# Patient Record
Sex: Male | Born: 1990 | Race: Black or African American | Hispanic: No | Marital: Single | Smoking: Never smoker
Health system: Southern US, Community
[De-identification: ages and names within clinical notes are randomized; demographics above are authoritative.]

## PROBLEM LIST (undated history)

## (undated) DIAGNOSIS — F1010 Alcohol abuse, uncomplicated: Secondary | ICD-10-CM

---
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
## 2010-12-10 ENCOUNTER — Emergency Department: Payer: Self-pay | Admitting: Emergency Medicine

## 2015-02-12 ENCOUNTER — Encounter (HOSPITAL_COMMUNITY): Payer: Self-pay | Admitting: Emergency Medicine

## 2015-02-12 ENCOUNTER — Emergency Department (HOSPITAL_COMMUNITY)
Admission: EM | Admit: 2015-02-12 | Discharge: 2015-02-12 | Disposition: A | Discharge status: Home / Self Care | Payer: Self-pay | Attending: Physician Assistant | Admitting: Physician Assistant

## 2015-02-12 DIAGNOSIS — M79661 Pain in right lower leg: Secondary | ICD-10-CM | POA: Insufficient documentation

## 2015-02-12 DIAGNOSIS — M79604 Pain in right leg: Secondary | ICD-10-CM

## 2015-02-12 DIAGNOSIS — Z720 Tobacco use: Secondary | ICD-10-CM | POA: Insufficient documentation

## 2015-02-12 NOTE — Discharge Instructions (Signed)
Please follow-up with primary care for further evaluation and management. Please use Tylenol, ibuprofen, ice as needed for pain.

## 2015-02-12 NOTE — ED Provider Notes (Signed)
CSN: [id number]     Arrival date & time 02/12/15  2159 History  This chart was scribed for non-physician practitioner, Eyvonne Mechanic, PA-C working with Abelino Derrick, MD by Doreatha Martin, ED scribe. This patient was seen in room TR09C/TR09C and the patient's care was started at 10:28 PM    Chief Complaint  Patient presents with  . Leg Pain   The history is provided by the patient. No language interpreter was used.    HPI Comments: Erik Amos. is a 24 y.o. male who presents to the Emergency Department complaining of moderate right leg pain to the medial aspect of the knee and upper calf, described as tightness, onset this morning. He states no pain with palpation. He states associated mild swelling to the right knee. He notes that he noticed bulging veins to his dorsal calf. Pt reports that pain is exacerbated with ambulation. He states he felt the pain as he was leaving work. 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 Pt is a current smoker. No Hx of PE/DVT, recent travel, surgery, prolonged immobilization, blood disorders, cancer. Pt is not currently followed by a PCP. Pt denies recent trauma, injury, heavy lifting, falls. He also denies SOB, CP, leg swelling other than at the knee.   History reviewed. No pertinent past surgical history. No family history on file. Social History  Substance Use Topics  . Smoking status: Never Smoker   . Smokeless tobacco: None  . Alcohol Use: No    Review of Systems  All other systems reviewed and are negative.  Allergies  Review of patient's allergies indicates no known allergies.  Home Medications   Prior to Admission medications   Medication Sig Start Date End Date Taking? Authorizing Provider  ibuprofen (ADVIL,MOTRIN) 200 MG tablet Take 200 mg by mouth every 6 (six) hours as needed for moderate pain.   Yes Historical Provider, MD  naphazoline-pheniramine (NAPHCON-A) 0.025-0.3 % ophthalmic solution Place 1 drop into both eyes daily as  needed for allergies.   Yes Historical Provider, MD   BP 127/73 mmHg  Pulse 63  Temp(Src) 98.7 F (37.1 C) (Oral)  Resp 18  SpO2 100%   Physical Exam  Constitutional: He is oriented to person, place, and time. He appears well-developed and well-nourished.  HENT:  Head: Normocephalic and atraumatic.  Eyes: Conjunctivae and EOM are normal. Pupils are equal, round, and reactive to light.  Neck: Normal range of motion. Neck supple.  Cardiovascular: Normal rate.   Pulmonary/Chest: Effort normal. No respiratory distress.  Abdominal: He exhibits no distension.  Musculoskeletal: Normal range of motion.  Left knee nontender to palpation, no pain to palpation of proximal or distal lower extremity, no obvious swelling or deformities, no rash. Ambulates without difficulty. minimal tenderness with hip flexion at the insertion of the biceps femoris, no pain with forced dorsiflexion. No lower extremity swelling or edema, legs are symmetrical bilateral  Neurological: He is alert and oriented to person, place, and time.  Skin: Skin is warm and dry.  Psychiatric: He has a normal mood and affect. His behavior is normal.  Nursing note and vitals reviewed.  ED Course  Procedures (including critical care time) Labs Review Labs Reviewed - No data to display  Imaging Review No results found. I have personally reviewed and evaluated these images and lab results as part of my medical decision-making.   EKG Interpretation None      MDM   Final diagnoses:  Pain of right lower extremity  Labs:  Imaging:  Consults:  Therapeutics:  Discharge Meds:   Assessment/Plan: Patient has no acute findings on exam, this is not septic arthritis, gouty arthritis, DVT, or any significant musculoskeletal injury. Patient requests a work note, with no significant findings on my exam he will not be given a work note. Advised pt to use ice, Ibuprofen, Tylenol, light stretching, follow up with a PCP. Informed  pt of strict return precautions.    I personally performed the services described in this documentation, which was scribed in my presence. The recorded information has been reviewed and is accurate.     Eyvonne Mechanic, PA-C 02/12/15 2344  Courteney Randall An, MD 02/13/15 1610

## 2015-02-12 NOTE — ED Notes (Signed)
Pt. reports pain behind right knee onset yesterday , denies injury/ambulatory.

## 2015-12-16 ENCOUNTER — Emergency Department
Admission: EM | Admit: 2015-12-16 | Discharge: 2015-12-16 | Disposition: A | Discharge status: Home / Self Care | Payer: Self-pay | Attending: Emergency Medicine | Admitting: Emergency Medicine

## 2015-12-16 DIAGNOSIS — H6993 Unspecified Eustachian tube disorder, bilateral: Secondary | ICD-10-CM | POA: Insufficient documentation

## 2015-12-16 DIAGNOSIS — J309 Allergic rhinitis, unspecified: Secondary | ICD-10-CM | POA: Insufficient documentation

## 2015-12-16 DIAGNOSIS — H6983 Other specified disorders of Eustachian tube, bilateral: Secondary | ICD-10-CM

## 2015-12-16 DIAGNOSIS — H8113 Benign paroxysmal vertigo, bilateral: Secondary | ICD-10-CM

## 2015-12-16 MED ORDER — CETIRIZINE HCL 10 MG PO TABS: 10.0000 mg | ORAL_TABLET | Freq: Every day | ORAL | Status: DC

## 2015-12-16 MED ORDER — MECLIZINE HCL 32 MG PO TABS: 32.0000 mg | ORAL_TABLET | Freq: Three times a day (TID) | ORAL | Status: AC | PRN

## 2015-12-16 MED ORDER — FLUTICASONE PROPIONATE 50 MCG/ACT NA SUSP: 1.0000 | Freq: Two times a day (BID) | NASAL | Status: DC

## 2015-12-16 NOTE — Discharge Instructions (Signed)
Allergic Rhinitis Allergic rhinitis is when the mucous membranes in the nose respond to allergens. Allergens are particles in the air that cause your body to have an allergic reaction. This causes you to release allergic antibodies. Through a chain of events, these eventually cause you to release histamine into the blood stream. Although meant to protect the body, it is this release of histamine that causes your discomfort, such as frequent sneezing, congestion, and an itchy, runny nose.  CAUSES Seasonal allergic rhinitis (hay fever) is caused by pollen allergens that may come from grasses, trees, and weeds. Year-round allergic rhinitis (perennial allergic rhinitis) is caused by allergens such as house dust mites, pet dander, and mold spores. SYMPTOMS  Nasal stuffiness (congestion).  Itchy, runny nose with sneezing and tearing of the eyes. DIAGNOSIS Your health care provider can help you determine the allergen or allergens that trigger your symptoms. If you and your health care provider are unable to determine the allergen, skin or blood testing may be used. Your health care provider will diagnose your condition after taking your health history and performing a physical exam. Your health care provider may assess you for other related conditions, such as asthma, pink eye, or an ear infection. TREATMENT Allergic rhinitis does not have a cure, but it can be controlled by:  Medicines that block allergy symptoms. These may include allergy shots, nasal sprays, and oral antihistamines.  Avoiding the allergen. Hay fever may often be treated with antihistamines in pill or nasal spray forms. Antihistamines block the effects of histamine. There are over-the-counter medicines that may help with nasal congestion and swelling around the eyes. Check with your health care provider before taking or giving this medicine. If avoiding the allergen or the medicine prescribed do not work, there are many new medicines  your health care provider can prescribe. Stronger medicine may be used if initial measures are ineffective. Desensitizing injections can be used if medicine and avoidance does not work. Desensitization is when a patient is given ongoing shots until the body becomes less sensitive to the allergen. Make sure you follow up with your health care provider if problems continue. HOME CARE INSTRUCTIONS It is not possible to completely avoid allergens, but you can reduce your symptoms by taking steps to limit your exposure to them. It helps to know exactly what you are allergic to so that you can avoid your specific triggers. SEEK MEDICAL CARE IF:  You have a fever.  You develop a cough that does not stop easily (persistent).  You have shortness of breath.  You start wheezing.  Symptoms interfere with normal daily activities.   This information is not intended to replace advice given to you by your health care provider. Make sure you discuss any questions you have with your health care provider.   Document Released: 02/22/2001 Document Revised: 06/20/2014 Document Reviewed: 02/04/2013 Elsevier Interactive Patient Education 2016 Else[employer] Inc[employer]  Benign Positional Vertigo Vertigo is the feeling that you or your surroundings are moving when they are not. Benign positional vertigo is the most common form of vertigo. The cause of this condition is not serious (is benign). This condition is triggered by certain movements and positions (is positional). This condition can be dangerous if it occurs while you are doing something that could endanger you or others, such as driving.  CAUSES In many cases, the cause of this condition is not known. It may be caused by a disturbance in an area of the inner ear that helps your brain  to sense movement and balance. This disturbance can be caused by a viral infection (labyrinthitis), head injury, or repetitive motion. RISK FACTORS This condition is more likely to develop  in:  Women.  People who are 25 years of age or older. SYMPTOMS Symptoms of this condition usually happen when you move your head or your eyes in different directions. Symptoms may start suddenly, and they usually last for less than a minute. Symptoms may include:  Loss of balance and falling.  Feeling like you are spinning or moving.  Feeling like your surroundings are spinning or moving.  Nausea and vomiting.  Blurred vision.  Dizziness.  Involuntary eye movement (nystagmus). Symptoms can be mild and cause only slight annoyance, or they can be severe and interfere with daily life. Episodes of benign positional vertigo may return (recur) over time, and they may be triggered by certain movements. Symptoms may improve over time. DIAGNOSIS This condition is usually diagnosed by medical history and a physical exam of the head, neck, and ears. You may be referred to a health care provider who specializes in ear, nose, and throat (ENT) problems (otolaryngologist) or a provider who specializes in disorders of the nervous system (neurologist). You may have additional testing, including:  MRI.  A CT scan.  Eye movement tests. Your health care provider may ask you to change positions quickly while he or she watches you for symptoms of benign positional vertigo, such as nystagmus. Eye movement may be tested with an electronystagmogram (ENG), caloric stimulation, the Dix-Hallpike test, or the roll test.  An electroencephalogram (EEG). This records electrical activity in your brain.  Hearing tests. TREATMENT Usually, your health care provider will treat this by moving your head in specific positions to adjust your inner ear back to normal. Surgery may be needed in severe cases, but this is rare. In some cases, benign positional vertigo may resolve on its own in 2-4 weeks. HOME CARE INSTRUCTIONS Safety  Move slowly.Avoid sudden body or head movements.  Avoid driving.  Avoid operating  heavy machinery.  Avoid doing any tasks that would be dangerous to you or others if a vertigo episode would occur.  If you have trouble walking or keeping your balance, try using a cane for stability. If you feel dizzy or unstable, sit down right away.  Return to your normal activities as told by your health care provider. Ask your health care provider what activities are safe for you. General Instructions  Take over-the-counter and prescription medicines only as told by your health care provider.  Avoid certain positions or movements as told by your health care provider.  Drink enough fluid to keep your urine clear or pale yellow.  Keep all follow-up visits as told by your health care provider. This is important. SEEK MEDICAL CARE IF:  You have a fever.  Your condition gets worse or you develop new symptoms.  Your family or friends notice any behavioral changes.  Your nausea or vomiting gets worse.  You have numbness or a "pins and needles" sensation. SEEK IMMEDIATE MEDICAL CARE IF:  You have difficulty speaking or moving.  You are always dizzy.  You faint.  You develop severe headaches.  You have weakness in your legs or arms.  You have changes in your hearing or vision.  You develop a stiff neck.  You develop sensitivity to light.   This information is not intended to replace advice given to you by your health care provider. Make sure you discuss any questions you  have with your health care provider.   Document Released: 03/07/2006 Document Revised: 02/18/2015 Document Reviewed: 09/22/2014 Elsevier Interactive Patient Education 2016 [employer] Barotitis media is inflammation of your middle ear. This occurs when the auditory tube (eustachian tube) leading from the back of your nose (nasopharynx) to your eardrum is blocked. This blockage may result from a cold, environmental allergies, or an upper respiratory infection. Unresolved barotitis media  may lead to damage or hearing loss (barotrauma), which may become permanent. HOME CARE INSTRUCTIONS   Use medicines as recommended by your health care provider. Over-the-counter medicines will help unblock the canal and can help during times of air travel.  Do not put anything into your ears to clean or unplug them. Eardrops will not be helpful.  Do not swim, dive, or fly until your health care provider says it is all right to do so. If these activities are necessary, chewing gum with frequent, forceful swallowing may help. It is also helpful to hold your nose and gently blow to pop your ears for equalizing pressure changes. This forces air into the eustachian tube.  Only take over-the-counter or prescription medicines for pain, discomfort, or fever as directed by your health care provider.  A decongestant may be helpful in decongesting the middle ear and make pressure equalization easier. SEEK MEDICAL CARE IF:  You experience a serious form of dizziness in which you feel as if the room is spinning and you feel nauseated (vertigo).  Your symptoms only involve one ear. SEEK IMMEDIATE MEDICAL CARE IF:   You develop a severe headache, dizziness, or severe ear pain.  You have bloody or pus-like drainage from your ears.  You develop a fever.  Your problems do not improve or become worse. MAKE SURE YOU:   Understand these instructions.  Will watch your condition.  Will get help right away if you are not doing well or get worse.   This information is not intended to replace advice given to you by your health care provider. Make sure you discuss any questions you have with your health care provider.   Document Released: 05/27/2000 Document Revised: 03/20/2013 Document Reviewed: 12/25/2012 Elsevier Interactive Patient Education [employer]

## 2015-12-16 NOTE — ED Notes (Signed)
Pt in via triage with complaints of being light headed, feeling as if his ears are "clogged" x 1 week.  Pt denies any other symptoms.  Pt A/Ox4, in no immediate distress.

## 2015-12-16 NOTE — ED Notes (Addendum)
Pt in with co not able to hear well, states hears muffled, bilat earaches. Denies any cold symptoms or illness, also co dizziness states google told him it was vertigo. Denies any n.v.d or other pain.

## 2015-12-16 NOTE — ED Provider Notes (Signed)
[facility] Emergency Department Provider Note  ____________________________________________  Time seen: Approximately 10:35 PM  I have reviewed the triage vital signs and the nursing notes.   HISTORY  Chief Complaint Ear Fullness    HPI Erik CruiseDavid F Lisbon Montez HagemanJr. is a 25 y.o. male who presents to emergency department complaining of "ear fullness" to bilateral ears, and vertigo-type symptoms. Patient states that he has seasonal allergies and has noticed that he has been sneezing more. Patient reports that when he stands he feels like the room spins around. Patient denies any headache, fevers or chills, ear pain, sore throat, nasal congestion, cough, shortness of breath, abdominal pain, nausea or vomiting.   No past medical history on file.  There are no active problems to display for this patient.   No past surgical history on file.  Current Outpatient Rx  Name  Route  Sig  Dispense  Refill  . cetirizine (ZYRTEC) 10 MG tablet   Oral   Take 1 tablet (10 mg total) by mouth daily.   30 tablet   0   . fluticasone (FLONASE) 50 MCG/ACT nasal spray   Each Nare   Place 1 spray into both nostrils 2 (two) times daily.   16 g   0   . ibuprofen (ADVIL,MOTRIN) 200 MG tablet   Oral   Take 200 mg by mouth every 6 (six) hours as needed for moderate pain.         . meclizine (ANTIVERT) 32 MG tablet   Oral   Take 1 tablet (32 mg total) by mouth 3 (three) times daily as needed.   30 tablet   0   . naphazoline-pheniramine (NAPHCON-A) 0.025-0.3 % ophthalmic solution   Both Eyes   Place 1 drop into both eyes daily as needed for allergies.           Allergies Review of patient's allergies indicates no known allergies.  No family history on file.  Social History Social History  Substance Use Topics  . Smoking status: Never Smoker   . Smokeless tobacco: Not on file  . Alcohol Use: No     Review of Systems  Constitutional: No fever/chills. Positive  for vertigo symptoms Eyes: No visual changes. No discharge ENT: Positive for bilateral "ear fullness" Cardiovascular: no chest pain. Respiratory: no cough. No SOB. Gastrointestinal: No abdominal pain.  No nausea, no vomiting.   Musculoskeletal: Negative for musculoskeletal pain. Skin: Negative for rash, abrasions, lacerations, ecchymosis. Neurological: Negative for headaches, focal weakness or numbness. 10-point ROS otherwise negative.  ____________________________________________   PHYSICAL EXAM:  VITAL SIGNS: ED Triage Vitals  Enc Vitals Group     BP 12/16/15 2126 129/65 mmHg     Pulse Rate 12/16/15 2126 87     Resp 12/16/15 2126 18     Temp 12/16/15 2126 98.8 F (37.1 C)     Temp Source 12/16/15 2126 Oral     SpO2 12/16/15 2126 99 %     Weight 12/16/15 2126 175 lb (79.379 kg)     Height 12/16/15 2126 5\' 11"  (1.803 m)     Head Cir --      Peak Flow --      Pain Score 12/16/15 2127 7     Pain Loc --      Pain Edu? --      Excl. in GC? --      Constitutional: Alert and oriented. Well appearing and in no acute distress. Eyes: Conjunctivae are normal. PERRL. EOMI. Head: Atraumatic.  ENT:      Ears: EACs unremarkable bilaterally. TMs are slightly bulging with no air-fluid level.      Nose: Mild clear congestion/rhinnorhea. Turbinates are boggy in appearance.      Mouth/Throat: Mucous membranes are moist.  Neck: No stridor.    Cardiovascular: Normal rate, regular rhythm. Normal S1 and S2.  Good peripheral circulation. Respiratory: Normal respiratory effort without tachypnea or retractions. Lungs CTAB. Good air entry to the bases with no decreased or absent breath sounds. Musculoskeletal: Full range of motion to all extremities. No gross deformities appreciated. Neurologic:  Normal speech and language. No gross focal neurologic deficits are appreciated.  Skin:  Skin is warm, dry and intact. No rash noted. Psychiatric: Mood and affect are normal. Speech and behavior are  normal. Patient exhibits appropriate insight and judgement.   ____________________________________________   LABS (all labs ordered are listed, but only abnormal results are displayed)  Labs Reviewed - No data to display ____________________________________________  EKG   ____________________________________________  RADIOLOGY   No results found.  ____________________________________________    PROCEDURES  Procedure(s) performed:       Medications - No data to display   ____________________________________________   INITIAL IMPRESSION / ASSESSMENT AND PLAN / ED COURSE  Pertinent labs & imaging results that were available during my care of the patient were reviewed by me and considered in my medical decision making (see chart for details).  Patient's diagnosis is consistent with allergic rhinitis causing eustachian tube dysfunction leading to vertigo.. Patient will be discharged home with prescriptions for Zyrtec, Flonase, meclizine. Patient is to follow up with primary care provider as needed or otherwise directed. Patient is given ED precautions to return to the ED for any worsening or new symptoms.     ____________________________________________  FINAL CLINICAL IMPRESSION(S) / ED DIAGNOSES  Final diagnoses:  Vertigo, benign paroxysmal, bilateral  Eustachian tube dysfunction, bilateral  Allergic rhinitis, unspecified allergic rhinitis type      NEW MEDICATIONS STARTED DURING THIS VISIT:  New Prescriptions   CETIRIZINE (ZYRTEC) 10 MG TABLET    Take 1 tablet (10 mg total) by mouth daily.   FLUTICASONE (FLONASE) 50 MCG/ACT NASAL SPRAY    Place 1 spray into both nostrils 2 (two) times daily.   MECLIZINE (ANTIVERT) 32 MG TABLET    Take 1 tablet (32 mg total) by mouth 3 (three) times daily as needed.        This chart was dictated using voice recognition software/Dragon. Despite best efforts to proofread, errors can occur which can change the  meaning. Any change was purely unintentional.    Racheal PatchesJonathan D Cuthriell, PA-C 12/16/15 2243  Arnaldo NatalPaul F Malinda, MD 12/16/15 [phone]

## 2016-12-13 ENCOUNTER — Emergency Department
Admission: EM | Admit: 2016-12-13 | Discharge: 2016-12-14 | Disposition: A | Discharge status: Home / Self Care | Payer: No Typology Code available for payment source | Attending: Emergency Medicine | Admitting: Emergency Medicine

## 2016-12-13 ENCOUNTER — Encounter: Payer: Self-pay | Admitting: *Deleted

## 2016-12-13 ENCOUNTER — Emergency Department: Payer: No Typology Code available for payment source

## 2016-12-13 DIAGNOSIS — M545 Low back pain: Secondary | ICD-10-CM | POA: Insufficient documentation

## 2016-12-13 DIAGNOSIS — Z79899 Other long term (current) drug therapy: Secondary | ICD-10-CM | POA: Diagnosis not present
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
## 2016-12-14 MED ORDER — MELOXICAM 7.5 MG PO TABS: 7.5000 mg | ORAL_TABLET | Freq: Every day | ORAL | 1 refills | Status: AC

## 2016-12-14 MED ORDER — CYCLOBENZAPRINE HCL 5 MG PO TABS: 5.0000 mg | ORAL_TABLET | Freq: Three times a day (TID) | ORAL | 0 refills | Status: AC | PRN

## 2016-12-14 NOTE — ED Notes (Signed)
Pt discharged to home.  Family member driving.  Discharge instructions reviewed.  Verbalized understanding.  No questions or concerns at this time.  Teach back verified.  Pt in NAD.  No items left in ED.   
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
## 2017-08-10 ENCOUNTER — Encounter: Payer: Self-pay | Admitting: Emergency Medicine

## 2017-08-10 ENCOUNTER — Emergency Department
Admission: EM | Admit: 2017-08-10 | Discharge: 2017-08-10 | Disposition: A | Discharge status: Home / Self Care | Payer: Self-pay | Attending: Emergency Medicine | Admitting: Emergency Medicine

## 2017-08-10 ENCOUNTER — Emergency Department: Payer: Self-pay

## 2017-08-10 DIAGNOSIS — Y92008 Other place in unspecified non-institutional (private) residence as the place of occurrence of the external cause: Secondary | ICD-10-CM | POA: Insufficient documentation

## 2017-08-10 DIAGNOSIS — Y9389 Activity, other specified: Secondary | ICD-10-CM | POA: Insufficient documentation

## 2017-08-10 DIAGNOSIS — Z79899 Other long term (current) drug therapy: Secondary | ICD-10-CM | POA: Insufficient documentation

## 2017-08-10 DIAGNOSIS — Y998 Other external cause status: Secondary | ICD-10-CM | POA: Insufficient documentation

## 2017-08-10 DIAGNOSIS — S8391XA Sprain of unspecified site of right knee, initial encounter: Secondary | ICD-10-CM | POA: Insufficient documentation

## 2017-08-10 DIAGNOSIS — Y33XXXA Other specified events, undetermined intent, initial encounter: Secondary | ICD-10-CM | POA: Insufficient documentation

## 2017-08-10 MED ORDER — IBUPROFEN 600 MG PO TABS: 600.0000 mg | ORAL_TABLET | Freq: Three times a day (TID) | ORAL | 0 refills | Status: DC | PRN

## 2017-08-10 NOTE — ED Triage Notes (Signed)
Pt comes into the ED via POV c/o knee swelling and pain that started 3 days ago.  Denies any known injury other than rolling over one day and hearing a "pop".  Patient ambulatory to triage at this time and in NAD with even and unlabored respirations.

## 2017-08-10 NOTE — ED Provider Notes (Signed)
[facility] Emergency Department Provider Note  ____________________________________________   First MD Initiated Contact with Patient 08/10/17 1423     (approximate)  I have reviewed the triage vital signs and the nursing notes.   HISTORY  Chief Complaint Joint Swelling  HPI Erik AlbaDavid F Gergen Jr. is a 27 y.o. male is here complaint of right knee pain.  Patient states that 3 nights ago he sitting on the sofa.  He states that he rolled his knee over and heard a pop.  He states that since that time he has had some knee pain along with some swelling.  He has not taken any over-the-counter medication is not applied any ice to this area.  He denies any previous knee problems.  Patient has continued to ambulate without assistance.   History reviewed. No pertinent past medical history.  There are no active problems to display for this patient.   History reviewed. No pertinent surgical history.  Prior to Admission medications   Medication Sig Start Date End Date Taking? Authorizing Provider  ibuprofen (ADVIL,MOTRIN) 600 MG tablet Take 1 tablet (600 mg total) by mouth every 8 (eight) hours as needed. 08/10/17   Tommi RumpsSummers, Rhonda L, PA-C  meclizine (ANTIVERT) 32 MG tablet Take 1 tablet (32 mg total) by mouth 3 (three) times daily as needed. 12/16/15   Cuthriell, Delorise RoyalsJonathan D, PA-C    Allergies Patient has no known allergies.  No family history on file.  Social History Social History   Tobacco Use  . Smoking status: Never Smoker  . Smokeless tobacco: Never Used  Substance Use Topics  . Alcohol use: No  . Drug use: No    Review of Systems Constitutional: No fever/chills Cardiovascular: Denies chest pain. Respiratory: Denies shortness of breath. Musculoskeletal: Positive for right knee pain. Skin: Negative for rash. Neurological: Negative for headaches, focal weakness or numbness. ____________________________________________   PHYSICAL EXAM:  VITAL  SIGNS: ED Triage Vitals  Enc Vitals Group     BP 08/10/17 1352 113/64     Pulse Rate 08/10/17 1352 63     Resp 08/10/17 1352 18     Temp 08/10/17 1352 99.2 F (37.3 C)     Temp Source 08/10/17 1352 Oral     SpO2 08/10/17 1352 94 %     Weight 08/10/17 1347 175 lb (79.4 kg)     Height 08/10/17 1347 6' (1.829 m)     Head Circumference --      Peak Flow --      Pain Score 08/10/17 1346 10     Pain Loc --      Pain Edu? --      Excl. in GC? --    Constitutional: Alert and oriented. Well appearing and in no acute distress. Eyes: Conjunctivae are normal.  Head: Atraumatic. Neck: No stridor.   Cardiovascular: Normal rate, regular rhythm. Grossly normal heart sounds.  Good peripheral circulation. Respiratory: Normal respiratory effort.  No retractions. Lungs CTAB. Musculoskeletal: On examination of the right knee there is some soft tissue swelling present.  No gross deformity.  Range of motion is restricted secondary to patient's pain tolerance.  No tenderness is noted to the tib-fib or ankle/foot.  No erythema is present.  Motor sensory function intact.  Skin is intact. Neurologic:  Normal speech and language. No gross focal neurologic deficits are appreciated. No gait instability. Skin:  Skin is warm, dry and intact. No rash noted. Psychiatric: Mood and affect are normal. Speech and behavior are normal.  ____________________________________________   LABS (all labs ordered are listed, but only abnormal results are displayed)  Labs Reviewed - No data to display RADIOLOGY  ED MD interpretation:  Right knee x-ray is negative for fracture.  Official radiology report(s): Dg Knee Complete 4 Views Right  Result Date: 08/10/2017 CLINICAL DATA:  Acute right knee pain. EXAM: RIGHT KNEE - COMPLETE 4+ VIEW COMPARISON:  None. FINDINGS: No evidence of fracture, dislocation, or joint effusion. No evidence of arthropathy or other focal bone abnormality. Soft tissues are unremarkable. IMPRESSION:  Normal right knee. Electronically Signed   By: Lupita Raider, M.D.   On: 08/10/2017 14:11    ____________________________________________   PROCEDURES  Procedure(s) performed: None  Procedures  Critical Care performed: No  ____________________________________________   INITIAL IMPRESSION / ASSESSMENT AND PLAN / ED COURSE Was placed in a knee immobilizer.  He was also given a prescription for ibuprofen 600 mg 3 times daily with food.  He is to ice and elevate as needed for swelling.  He is to refrain from sports as he plays basketball for the next 2 weeks.  Patient also is to follow-up with Dr. Hyacinth Meeker who is the orthopedist on call if he is not improving.  ____________________________________________   FINAL CLINICAL IMPRESSION(S) / ED DIAGNOSES  Final diagnoses:  Sprain of right knee, unspecified ligament, initial encounter     ED Discharge Orders        Ordered    ibuprofen (ADVIL,MOTRIN) 600 MG tablet  Every 8 hours PRN     08/10/17 1441       Note:  This document was prepared using Dragon voice recognition software and may include unintentional dictation errors.    Tommi Rumps, PA-C 08/10/17 1640    Governor Rooks, MD 08/12/17 [phone]
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
## 2017-08-10 NOTE — Discharge Instructions (Signed)
Up with Dr. Hyacinth MeekerMiller who is the orthopedist on call if any continued problems.  Wear knee immobilizer for 1-2 weeks for support and to prevent further injury.  Ice and elevation.  Ibuprofen as needed for pain and inflammation.

## 2017-11-13 ENCOUNTER — Emergency Department
Admission: EM | Admit: 2017-11-13 | Discharge: 2017-11-13 | Disposition: A | Discharge status: Home / Self Care | Payer: No Typology Code available for payment source | Attending: Emergency Medicine | Admitting: Emergency Medicine

## 2017-11-13 ENCOUNTER — Other Ambulatory Visit: Payer: Self-pay

## 2017-11-13 ENCOUNTER — Encounter: Payer: Self-pay | Admitting: Emergency Medicine

## 2017-11-13 ENCOUNTER — Emergency Department: Payer: No Typology Code available for payment source

## 2017-11-13 DIAGNOSIS — M25561 Pain in right knee: Secondary | ICD-10-CM | POA: Insufficient documentation

## 2017-11-13 DIAGNOSIS — Y92410 Unspecified street and highway as the place of occurrence of the external cause: Secondary | ICD-10-CM | POA: Diagnosis not present

## 2017-11-13 DIAGNOSIS — S39012A Strain of muscle, fascia and tendon of lower back, initial encounter: Secondary | ICD-10-CM | POA: Diagnosis not present

## 2017-11-13 DIAGNOSIS — Y998 Other external cause status: Secondary | ICD-10-CM | POA: Insufficient documentation

## 2017-11-13 DIAGNOSIS — Y939 Activity, unspecified: Secondary | ICD-10-CM | POA: Diagnosis not present

## 2017-11-13 MED ORDER — CYCLOBENZAPRINE HCL 5 MG PO TABS: 5.0000 mg | ORAL_TABLET | Freq: Three times a day (TID) | ORAL | 0 refills | Status: AC | PRN

## 2017-11-13 MED ORDER — NAPROXEN 500 MG PO TABS: 500.0000 mg | ORAL_TABLET | Freq: Two times a day (BID) | ORAL | 0 refills | Status: AC
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
## 2018-01-02 ENCOUNTER — Emergency Department
Admission: EM | Admit: 2018-01-02 | Discharge: 2018-01-02 | Disposition: A | Discharge status: Home / Self Care | Payer: Self-pay | Attending: Emergency Medicine | Admitting: Emergency Medicine

## 2018-01-02 ENCOUNTER — Other Ambulatory Visit: Payer: Self-pay

## 2018-01-02 ENCOUNTER — Encounter: Payer: Self-pay | Admitting: Emergency Medicine

## 2018-01-02 ENCOUNTER — Emergency Department: Payer: Self-pay

## 2018-01-02 DIAGNOSIS — Y999 Unspecified external cause status: Secondary | ICD-10-CM | POA: Insufficient documentation

## 2018-01-02 DIAGNOSIS — W260XXA Contact with knife, initial encounter: Secondary | ICD-10-CM | POA: Insufficient documentation

## 2018-01-02 DIAGNOSIS — Y939 Activity, unspecified: Secondary | ICD-10-CM | POA: Insufficient documentation

## 2018-01-02 DIAGNOSIS — Y929 Unspecified place or not applicable: Secondary | ICD-10-CM | POA: Insufficient documentation

## 2018-01-02 DIAGNOSIS — Z23 Encounter for immunization: Secondary | ICD-10-CM | POA: Insufficient documentation

## 2018-01-02 DIAGNOSIS — S61236A Puncture wound without foreign body of right little finger without damage to nail, initial encounter: Secondary | ICD-10-CM | POA: Insufficient documentation

## 2018-01-02 MED ORDER — NAPROXEN 500 MG PO TABS: 500.0000 mg | ORAL_TABLET | Freq: Two times a day (BID) | ORAL | Status: DC

## 2018-01-02 MED ORDER — NAPROXEN 500 MG PO TABS
500.0000 mg | ORAL_TABLET | Freq: Once | ORAL | Status: AC
  Administered 2018-01-02: 500 mg via ORAL
  Filled 2018-01-02: qty 1

## 2018-01-02 MED ORDER — SULFAMETHOXAZOLE-TRIMETHOPRIM 800-160 MG PO TABS: 1.0000 | ORAL_TABLET | Freq: Two times a day (BID) | ORAL | 0 refills | Status: AC

## 2018-01-02 MED ORDER — TETANUS-DIPHTH-ACELL PERTUSSIS 5-2.5-18.5 LF-MCG/0.5 IM SUSP
0.5000 mL | Freq: Once | INTRAMUSCULAR | Status: AC
  Administered 2018-01-02: 0.5 mL via INTRAMUSCULAR
  Filled 2018-01-02: qty 0.5

## 2018-01-02 MED ORDER — SULFAMETHOXAZOLE-TRIMETHOPRIM 800-160 MG PO TABS
1.0000 | ORAL_TABLET | Freq: Once | ORAL | Status: AC
  Administered 2018-01-02: 1 via ORAL
  Filled 2018-01-02: qty 1

## 2018-01-02 NOTE — Discharge Instructions (Addendum)
Wear splint for 2 to 3 days as needed.  Pick up prescription from pharmacy.  [facility]
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
## 2018-04-30 ENCOUNTER — Emergency Department: Payer: Self-pay

## 2018-04-30 ENCOUNTER — Emergency Department
Admission: EM | Admit: 2018-04-30 | Discharge: 2018-04-30 | Disposition: A | Discharge status: Home / Self Care | Payer: Self-pay | Attending: Emergency Medicine | Admitting: Emergency Medicine

## 2018-04-30 ENCOUNTER — Encounter: Payer: Self-pay | Admitting: Emergency Medicine

## 2018-04-30 ENCOUNTER — Other Ambulatory Visit: Payer: Self-pay

## 2018-04-30 DIAGNOSIS — Z79899 Other long term (current) drug therapy: Secondary | ICD-10-CM | POA: Insufficient documentation

## 2018-04-30 DIAGNOSIS — R52 Pain, unspecified: Secondary | ICD-10-CM

## 2018-04-30 DIAGNOSIS — M25561 Pain in right knee: Secondary | ICD-10-CM

## 2018-04-30 MED ORDER — IBUPROFEN 600 MG PO TABS: 600.0000 mg | ORAL_TABLET | Freq: Three times a day (TID) | ORAL | 0 refills | Status: DC | PRN

## 2018-04-30 MED ORDER — CYCLOBENZAPRINE HCL 10 MG PO TABS: 10.0000 mg | ORAL_TABLET | Freq: Three times a day (TID) | ORAL | 0 refills | Status: AC | PRN

## 2018-04-30 MED ORDER — IBUPROFEN 600 MG PO TABS
600.0000 mg | ORAL_TABLET | Freq: Once | ORAL | Status: AC
  Administered 2018-04-30: 600 mg via ORAL
  Filled 2018-04-30: qty 1

## 2018-04-30 MED ORDER — CYCLOBENZAPRINE HCL 10 MG PO TABS
10.0000 mg | ORAL_TABLET | Freq: Once | ORAL | Status: AC
  Administered 2018-04-30: 10 mg via ORAL
  Filled 2018-04-30: qty 1

## 2018-04-30 MED ORDER — TRAMADOL HCL 50 MG PO TABS: 50.0000 mg | ORAL_TABLET | Freq: Two times a day (BID) | ORAL | 0 refills | Status: AC | PRN

## 2018-04-30 NOTE — ED Notes (Signed)
See triage note  Presents with pain to right knee this am  States pain started when he got up from chair  States pain is mainly posterior  No swelling noted

## 2018-04-30 NOTE — Discharge Instructions (Addendum)
Your x-ray shows no acute bony abnormalities.  Wear knee support and ambulate with crutches until evaluation by orthopedics.  Call today to schedule appointment.

## 2018-04-30 NOTE — ED Triage Notes (Signed)
Says last night he stood up and had pain in right knee. Now says he canno put weight on it. Says feels like it did whenhe tore acl
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
## 2020-04-03 ENCOUNTER — Emergency Department
Admission: EM | Admit: 2020-04-03 | Discharge: 2020-04-03 | Disposition: A | Discharge status: Home / Self Care | Payer: Self-pay | Attending: Emergency Medicine | Admitting: Emergency Medicine

## 2020-04-03 ENCOUNTER — Encounter: Payer: Self-pay | Admitting: Emergency Medicine

## 2020-04-03 ENCOUNTER — Other Ambulatory Visit: Payer: Self-pay

## 2020-04-03 DIAGNOSIS — T148XXA Other injury of unspecified body region, initial encounter: Secondary | ICD-10-CM

## 2020-04-03 DIAGNOSIS — S51841A Puncture wound with foreign body of right forearm, initial encounter: Secondary | ICD-10-CM | POA: Insufficient documentation

## 2020-04-03 DIAGNOSIS — W450XXA Nail entering through skin, initial encounter: Secondary | ICD-10-CM | POA: Insufficient documentation

## 2020-04-03 MED ORDER — MUPIROCIN 2 % EX OINT: TOPICAL_OINTMENT | CUTANEOUS | 0 refills | Status: AC

## 2020-04-03 NOTE — ED Notes (Signed)
Pt laying in bed on phone with friend. In NAD. Moving R arm around with easy.

## 2020-04-03 NOTE — ED Triage Notes (Signed)
Pt presents to ED via POV with c/o puncture wound to L forearm, mild abrasion noted to posterior forearm at this time, no bleeding noted, pt states does not wish to file as workers comp. Pt A&O x4, playing on phone in triage, VSS in triage.  Pt states last tetanus within 5 yrs.

## 2020-04-03 NOTE — ED Notes (Signed)
Pt has small cut from a nail to his R fa from work yesterday. States initially it bled some. Area is already scabbed over; no bleeding noted. Pt denies fever. Reports last tetanus shot was "about 2 years ago". Pt able to move arm appropriately.

## 2020-04-03 NOTE — ED Notes (Signed)
Pt refused WC

## 2020-04-03 NOTE — ED Provider Notes (Signed)
[facility] Emergency Department Provider Note  ____________________________________________  Time seen: Approximately 12:02 PM  I have reviewed the triage vital signs and the nursing notes.   HISTORY  Chief Complaint Puncture Wound   HPI Erik Hausmann Maurer Montez Hageman. is a 29 y.o. male who presents to the emergency department for treatment and evaluation of puncture wound to the right forearm that occurred yesterday at work. He states that a board with a nail poked his arm. Tdap is within 2 years.    History reviewed. No pertinent past medical history.  There are no problems to display for this patient.   History reviewed. No pertinent surgical history.  Prior to Admission medications   Medication Sig Start Date End Date Taking? Authorizing Provider  cyclobenzaprine (FLEXERIL) 10 MG tablet Take 1 tablet (10 mg total) by mouth 3 (three) times daily as needed. 04/30/18   Joni Reining, PA-C  cyclobenzaprine (FLEXERIL) 5 MG tablet Take 1 tablet (5 mg total) by mouth 3 (three) times daily as needed for muscle spasms. 11/13/17   Menshew, Charlesetta Ivory, PA-C  ibuprofen (ADVIL,MOTRIN) 600 MG tablet Take 1 tablet (600 mg total) by mouth every 8 (eight) hours as needed. 08/10/17   Tommi Rumps, PA-C  ibuprofen (ADVIL,MOTRIN) 600 MG tablet Take 1 tablet (600 mg total) by mouth every 8 (eight) hours as needed. 04/30/18   Joni Reining, PA-C  meclizine (ANTIVERT) 32 MG tablet Take 1 tablet (32 mg total) by mouth 3 (three) times daily as needed. 12/16/15   Cuthriell, Delorise Royals, PA-C  mupirocin ointment (BACTROBAN) 2 % Apply to affected area 3 times daily 04/03/20 04/03/21  Kem Boroughs B, FNP  naproxen (NAPROSYN) 500 MG tablet Take 1 tablet (500 mg total) by mouth 2 (two) times daily with a meal. 01/02/18   Joni Reining, PA-C  sulfamethoxazole-trimethoprim (BACTRIM DS,SEPTRA DS) 800-160 MG tablet Take 1 tablet by mouth 2 (two) times daily. 01/02/18   Joni Reining, PA-C   traMADol (ULTRAM) 50 MG tablet Take 1 tablet (50 mg total) by mouth every 12 (twelve) hours as needed. 04/30/18   Joni Reining, PA-C    Allergies Patient has no known allergies.  History reviewed. No pertinent family history.  Social History Social History   Tobacco Use  . Smoking status: Never Smoker  . Smokeless tobacco: Never Used  Substance Use Topics  . Alcohol use: Yes    Comment: Occ  . Drug use: No    Review of Systems  Constitutional: Negative for fever. Respiratory: Negative for cough or shortness of breath.  Musculoskeletal: Negative for myalgias Skin: Positive for puncture wound Neurological: Negative for numbness or paresthesias. ____________________________________________   PHYSICAL EXAM:  VITAL SIGNS: ED Triage Vitals [04/03/20 1048]  Enc Vitals Group     BP (!) 131/91     Pulse Rate 73     Resp 20     Temp 99 F (37.2 C)     Temp Source Oral     SpO2 99 %     Weight 165 lb (74.8 kg)     Height 5\' 11"  (1.803 m)     Head Circumference      Peak Flow      Pain Score 0     Pain Loc      Pain Edu?      Excl. in GC?      Constitutional: Overall well appearing. Eyes: Conjunctivae are clear without discharge or drainage. Nose: No rhinorrhea  noted. Mouth/Throat: Airway is patent.  Neck: No stridor. Unrestricted range of motion observed. Cardiovascular: Capillary refill is <3 seconds.  Respiratory: Respirations are even and unlabored.. Musculoskeletal: Unrestricted range of motion observed. Neurologic: Awake, alert, and oriented x 4.  Skin: Pinpoint scab on right forearm without bleeding, surrounding erythema, or drainage.  ____________________________________________   LABS (all labs ordered are listed, but only abnormal results are displayed)  Labs Reviewed - No data to display ____________________________________________  EKG  Not indicated. ____________________________________________  RADIOLOGY  Not  indicated. ____________________________________________   PROCEDURES  Procedures ____________________________________________   INITIAL IMPRESSION / ASSESSMENT AND PLAN / ED COURSE  Erik Cruise Bassette Montez Hageman. is a 29 y.o. male presents to the emergency department for treatment and evaluation of puncture wound.  Area appears to be well healed. No evidence of cellulitis or penetration of the nail beyond the dermis. Will treat with antibiotic ointment.    Medications - No data to display   Pertinent labs & imaging results that were available during my care of the patient were reviewed by me and considered in my medical decision making (see chart for details).  ____________________________________________   FINAL CLINICAL IMPRESSION(S) / ED DIAGNOSES  Final diagnoses:  Puncture wound    ED Discharge Orders         Ordered    mupirocin ointment (BACTROBAN) 2 %        04/03/20 1217           Note:  This document was prepared using Dragon voice recognition software and may include unintentional dictation errors.   Chinita Pester, FNP 04/03/20 1220    Sharman Cheek, MD 04/03/20 1520

## 2020-06-10 ENCOUNTER — Emergency Department: Payer: 59

## 2020-06-10 ENCOUNTER — Other Ambulatory Visit: Payer: Self-pay

## 2020-06-10 ENCOUNTER — Emergency Department
Admission: EM | Admit: 2020-06-10 | Discharge: 2020-06-10 | Disposition: A | Discharge status: Home / Self Care | Payer: 59 | Attending: Emergency Medicine | Admitting: Emergency Medicine

## 2020-06-10 ENCOUNTER — Encounter: Payer: Self-pay | Admitting: Emergency Medicine

## 2020-06-10 DIAGNOSIS — S60221A Contusion of right hand, initial encounter: Secondary | ICD-10-CM | POA: Insufficient documentation

## 2020-06-10 DIAGNOSIS — W228XXA Striking against or struck by other objects, initial encounter: Secondary | ICD-10-CM | POA: Diagnosis not present

## 2020-06-10 DIAGNOSIS — Y990 Civilian activity done for income or pay: Secondary | ICD-10-CM | POA: Diagnosis not present

## 2020-06-10 DIAGNOSIS — S6991XA Unspecified injury of right wrist, hand and finger(s), initial encounter: Secondary | ICD-10-CM | POA: Diagnosis present

## 2020-06-10 MED ORDER — NAPROXEN 500 MG PO TABS: 500.0000 mg | ORAL_TABLET | Freq: Two times a day (BID) | ORAL | 0 refills | Status: AC

## 2020-06-10 NOTE — ED Triage Notes (Signed)
Pt reports Friday hurt his right hand on a piece of steel at work. Pt does not want to file WC

## 2020-06-10 NOTE — Discharge Instructions (Signed)
Ice, wear ACE bandage, and take the Naprosyn as prescribed.  Follow up with primary care or return to the ER for symptoms that change or worsen.

## 2020-06-10 NOTE — ED Notes (Signed)
C/O right hand pain since Friday.  States he punched a sign on Friday.  Has been working all weekend, lift boxes etc.  C/O continued pain.
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
## 2020-06-10 NOTE — ED Notes (Signed)
AAOx3.  Skin warm and dry.  NAD 

## 2020-08-16 IMAGING — DX DG KNEE COMPLETE 4+V*R*
4 series · 4 of 4 positions shown · non-contrast
Comparison: 11/13/2017

CLINICAL DATA: Acute onset of knee pain last night and pain with
weightbearing.

EXAM:
RIGHT KNEE - COMPLETE 4+ VIEW

[knee ap]
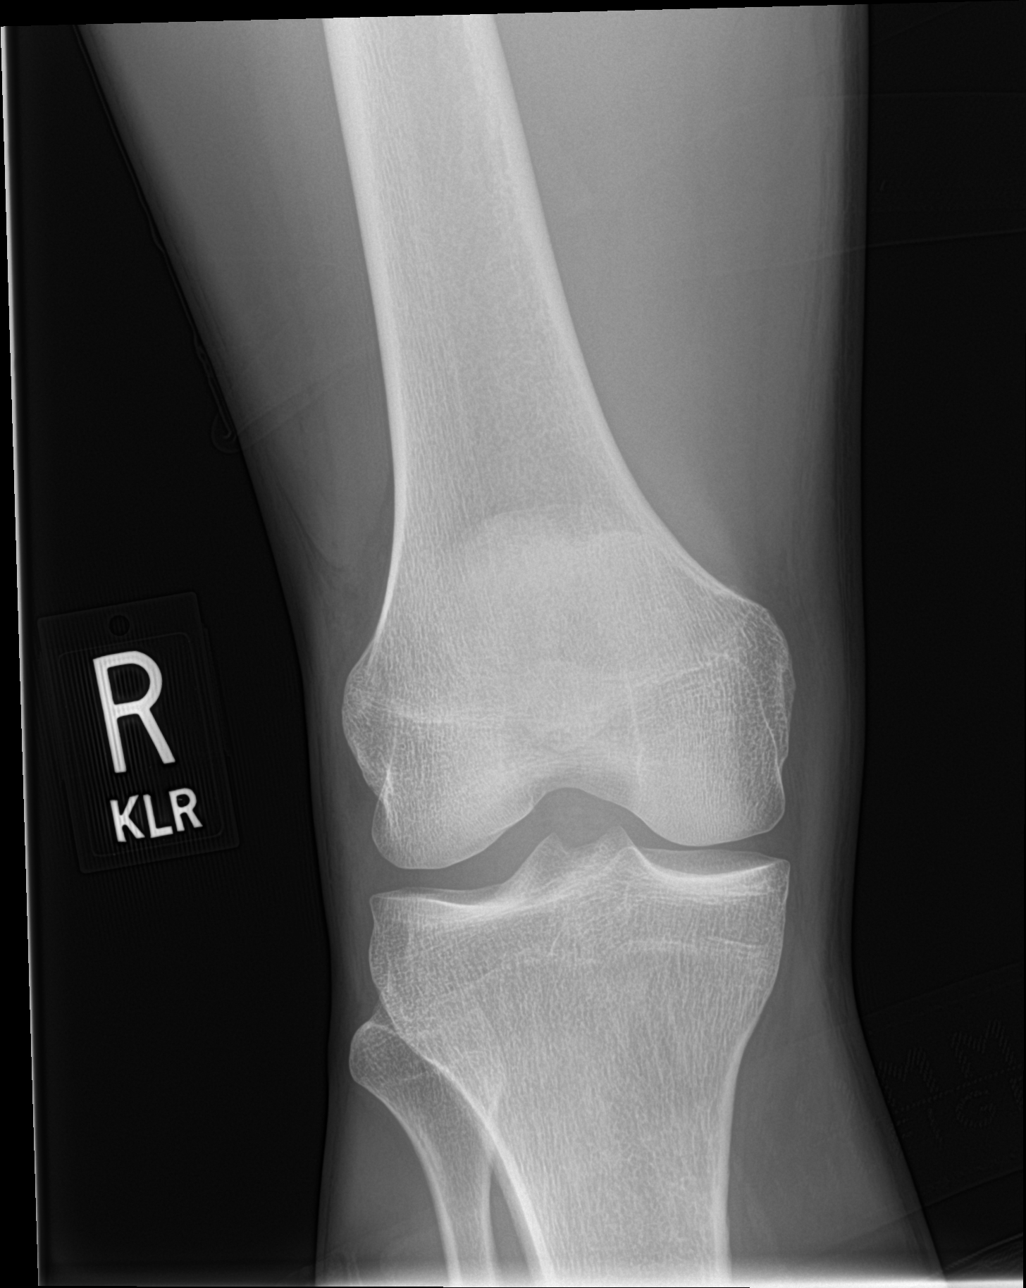

[knee lat]
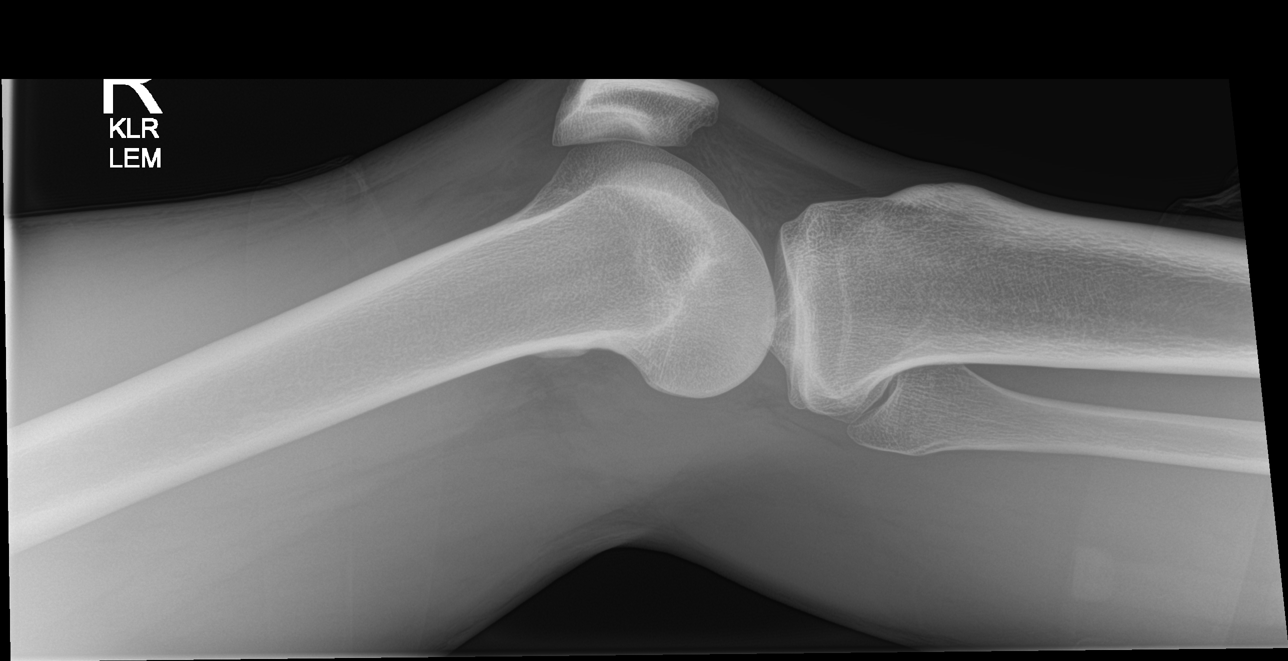

[knee obl (1 of 2)]
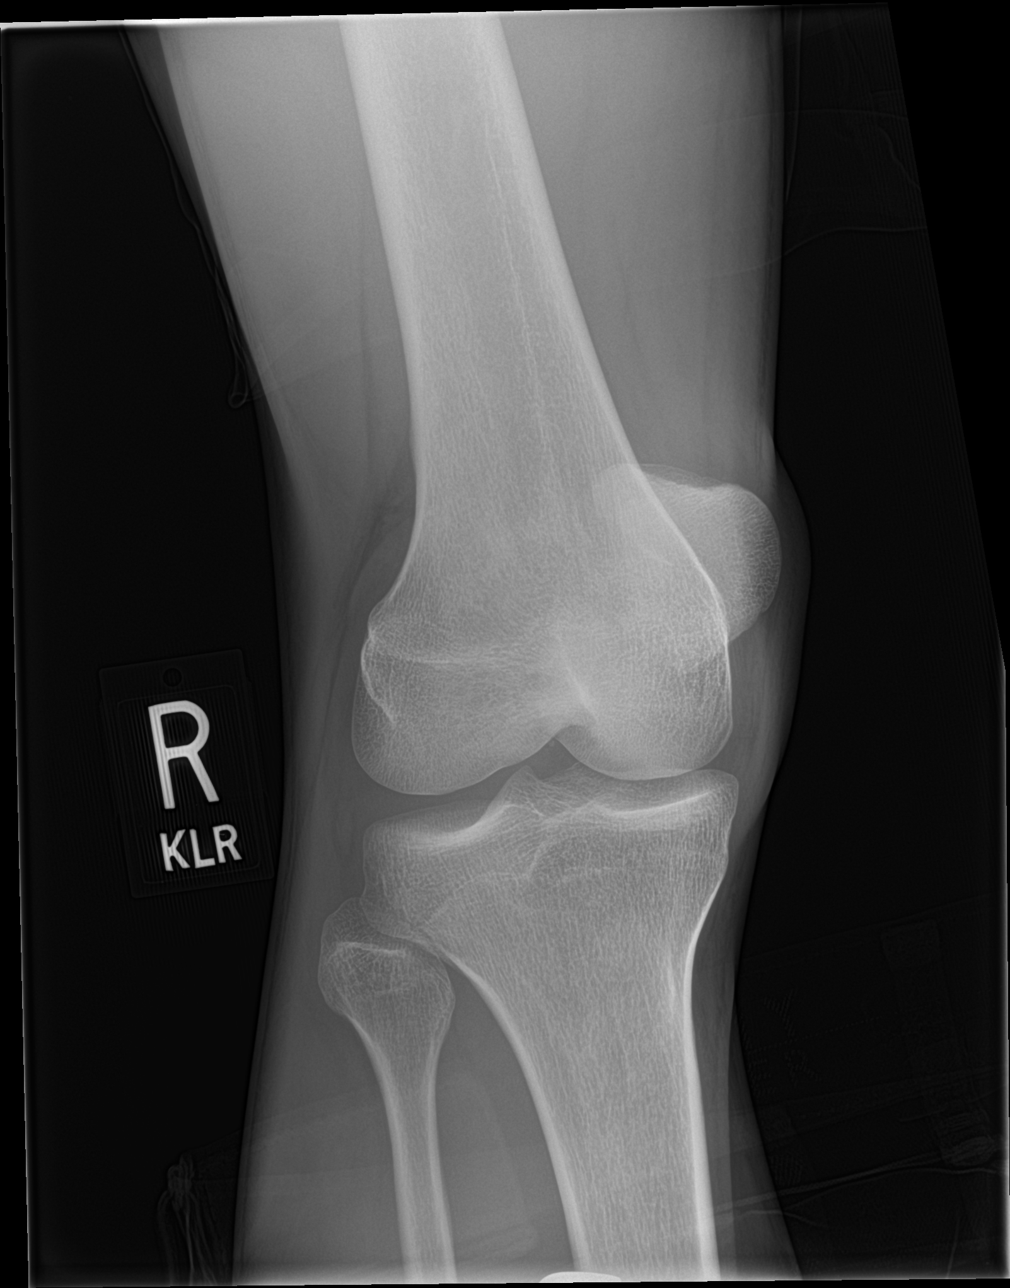

[knee obl (2 of 2)]
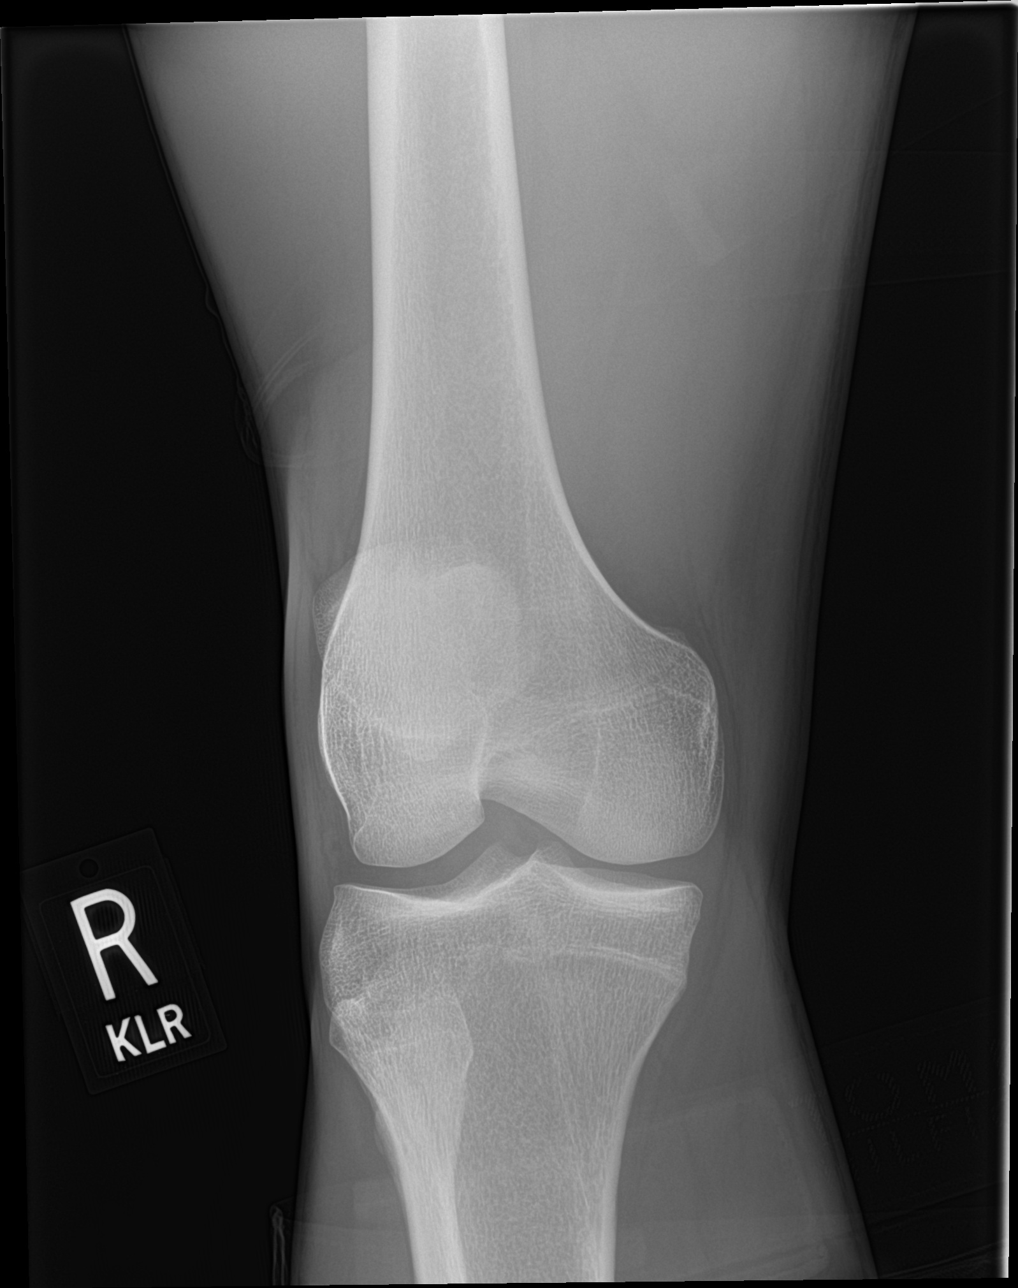

[4 of 4 positions shown; findings below may reference images not displayed]

FINDINGS: No fracture or dislocation. Joint spaces are preserved. No joint
effusion. No evidence of chondrocalcinosis. Regional soft tissues
appear normal.
IMPRESSION: Normal radiographs of the right knee.

## 2023-02-20 DIAGNOSIS — D72829 Elevated white blood cell count, unspecified: Secondary | ICD-10-CM | POA: Diagnosis not present

## 2023-02-20 DIAGNOSIS — N342 Other urethritis: Secondary | ICD-10-CM | POA: Diagnosis not present

## 2023-02-20 DIAGNOSIS — R309 Painful micturition, unspecified: Secondary | ICD-10-CM | POA: Diagnosis present

## 2023-02-21 ENCOUNTER — Telehealth: Payer: Self-pay | Admitting: Emergency Medicine

## 2023-02-21 ENCOUNTER — Emergency Department
Admission: EM | Admit: 2023-02-21 | Discharge: 2023-02-21 | Disposition: A | Discharge status: Home / Self Care | Payer: 59 | Attending: Emergency Medicine | Admitting: Emergency Medicine

## 2023-02-21 ENCOUNTER — Other Ambulatory Visit: Payer: Self-pay

## 2023-02-21 DIAGNOSIS — N342 Other urethritis: Secondary | ICD-10-CM

## 2023-02-21 LAB — LIPASE, BLOOD: Lipase: 27 U/L (ref 11–51)

## 2023-02-21 LAB — CBC
HCT: 50.7 % (ref 39.0–52.0)
Hemoglobin: 17.3 g/dL — ABNORMAL HIGH (ref 13.0–17.0)
MCH: 31.5 pg (ref 26.0–34.0)
MCHC: 34.1 g/dL (ref 30.0–36.0)
MCV: 92.3 fL (ref 80.0–100.0)
Platelets: 369 10*3/uL (ref 150–400)
RBC: 5.49 MIL/uL (ref 4.22–5.81)
RDW: 13.8 % (ref 11.5–15.5)
WBC: 11.3 10*3/uL — ABNORMAL HIGH (ref 4.0–10.5)
nRBC: 0 % (ref 0.0–0.2)

## 2023-02-21 LAB — URINALYSIS, ROUTINE W REFLEX MICROSCOPIC
Bacteria, UA: NONE SEEN
Bilirubin Urine: NEGATIVE
Glucose, UA: NEGATIVE mg/dL
Hgb urine dipstick: NEGATIVE
Ketones, ur: NEGATIVE mg/dL
Nitrite: NEGATIVE
Protein, ur: NEGATIVE mg/dL
Specific Gravity, Urine: 1.026 (ref 1.005–1.030)
WBC, UA: >50 WBC/hpf (ref 0–5)
pH: 5 (ref 5.0–8.0)

## 2023-02-21 LAB — COMPREHENSIVE METABOLIC PANEL
ALT: 10 U/L (ref 0–44)
AST: 14 U/L — ABNORMAL LOW (ref 15–41)
Albumin: 3.7 g/dL (ref 3.5–5.0)
Alkaline Phosphatase: 72 U/L (ref 38–126)
Anion gap: 8 (ref 5–15)
BUN: 9 mg/dL (ref 6–20)
CO2: 30 mmol/L (ref 22–32)
Calcium: 9.3 mg/dL (ref 8.9–10.3)
Chloride: 102 mmol/L (ref 98–111)
Creatinine, Ser: 1.05 mg/dL (ref 0.61–1.24)
GFR, Estimated: >60 mL/min (ref >60)
Glucose, Bld: 84 mg/dL (ref 70–99)
Potassium: 4.1 mmol/L (ref 3.5–5.1)
Sodium: 140 mmol/L (ref 135–145)
Total Bilirubin: 0.6 mg/dL (ref 0.3–1.2)
Total Protein: 7.3 g/dL (ref 6.5–8.1)

## 2023-02-21 LAB — CHLAMYDIA/NGC RT PCR (ARMC ONLY)
Chlamydia Tr: NOT DETECTED
N gonorrhoeae: DETECTED — AB

## 2023-02-21 MED ORDER — DOXYCYCLINE HYCLATE 100 MG PO TABS
100.0000 mg | ORAL_TABLET | Freq: Once | ORAL | Status: AC
  Administered 2023-02-21: 100 mg via ORAL
  Filled 2023-02-21: qty 1

## 2023-02-21 MED ORDER — CEFTRIAXONE SODIUM 500 MG IJ SOLR
500.0000 mg | Freq: Once | INTRAMUSCULAR | Status: AC
  Administered 2023-02-21: 500 mg via INTRAMUSCULAR
  Filled 2023-02-21: qty 500

## 2023-02-21 MED ORDER — DOXYCYCLINE HYCLATE 100 MG PO CAPS: 100.0000 mg | ORAL_CAPSULE | Freq: Two times a day (BID) | ORAL | 0 refills | Status: AC

## 2023-02-21 NOTE — Telephone Encounter (Signed)
Called patient to inform of std results.  Explained results.  Was treated during visit.  Partner treatment explained and that free treatment is available at [facility].

## 2023-02-21 NOTE — ED Triage Notes (Signed)
Pt presents ambulatory to triage via POV with complaints of lower abdominal pain x 2 days. Denies any associated sx but rates the pain 6/10. No meds taken PTA. A&Ox4 at this time. Denies CP or SOB.

## 2023-02-21 NOTE — Discharge Instructions (Signed)
You have received treatment today for gonorrhea and chlamydia.  Please avoid any sexual intercourse (oral, vaginal or anal) for the next week and symptoms have resolved.  Any recent sexual partners will need to be treated as well.  You may alternate Tylenol 1000 mg every 6 hours as needed for pain, fever and Ibuprofen 800 mg every 6-8 hours as needed for pain, fever.  Please take Ibuprofen with food.  Do not take more than 4000 mg of Tylenol (acetaminophen) in a 24 hour period.

## 2023-02-21 NOTE — ED Provider Notes (Signed)
[facility] Provider Note    Event Date/Time   First MD Initiated Contact with Patient 02/21/23 0157     (approximate)   History   Abdominal Pain   HPI  Erik Delorbe Marchesi Montez Hageman. is a 32 y.o. male with no significant past medical history presents emergency department lower abdominal pain and pain with urination.  He is concerned for possible sexually transmitted infection.  No penile discharge, testicular pain or swelling.   History provided by patient.    No past medical history on file.  No past surgical history on file.  MEDICATIONS:  Prior to Admission medications   Medication Sig Start Date End Date Taking? Authorizing Provider  cyclobenzaprine (FLEXERIL) 10 MG tablet Take 1 tablet (10 mg total) by mouth 3 (three) times daily as needed. 04/30/18   Joni Reining, PA-C  cyclobenzaprine (FLEXERIL) 5 MG tablet Take 1 tablet (5 mg total) by mouth 3 (three) times daily as needed for muscle spasms. 11/13/17   Menshew, Charlesetta Ivory, PA-C  meclizine (ANTIVERT) 32 MG tablet Take 1 tablet (32 mg total) by mouth 3 (three) times daily as needed. 12/16/15   Cuthriell, Delorise Royals, PA-C  naproxen (NAPROSYN) 500 MG tablet Take 1 tablet (500 mg total) by mouth 2 (two) times daily with a meal. 06/10/20   Triplett, Cari B, FNP  sulfamethoxazole-trimethoprim (BACTRIM DS,SEPTRA DS) 800-160 MG tablet Take 1 tablet by mouth 2 (two) times daily. 01/02/18   Joni Reining, PA-C  traMADol (ULTRAM) 50 MG tablet Take 1 tablet (50 mg total) by mouth every 12 (twelve) hours as needed. 04/30/18   Joni Reining, PA-C    Physical Exam   Triage Vital Signs: ED Triage Vitals  Encounter Vitals Group     BP 02/21/23 0003 125/84     Systolic BP Percentile --      Diastolic BP Percentile --      Pulse Rate 02/21/23 0003 85     Resp 02/21/23 0003 18     Temp 02/21/23 0003 98.4 F (36.9 C)     Temp Source 02/21/23 0003 Oral     SpO2 02/21/23 0003 100 %     Weight 02/21/23 0002  180 lb (81.6 kg)     Height 02/21/23 0002 5\' 11"  (1.803 m)     Head Circumference --      Peak Flow --      Pain Score 02/21/23 0003 6     Pain Loc --      Pain Education --      Exclude from Growth Chart --     Most recent vital signs: Vitals:   02/21/23 0003  BP: 125/84  Pulse: 85  Resp: 18  Temp: 98.4 F (36.9 C)  SpO2: 100%    CONSTITUTIONAL: Alert, responds appropriately to questions. Well-appearing; well-nourished HEAD: Normocephalic, atraumatic EYES: Conjunctivae clear, pupils appear equal, sclera nonicteric ENT: normal nose; moist mucous membranes NECK: Supple, normal ROM CARD: RRR; S1 and S2 appreciated RESP: Normal chest excursion without splinting or tachypnea; breath sounds clear and equal bilaterally; no wheezes, no rhonchi, no rales, no hypoxia or respiratory distress, speaking full sentences ABD/GI: Non-distended; soft, non-tender, no rebound, no guarding, no peritoneal signs BACK: The back appears normal EXT: Normal ROM in all joints; no deformity noted, no edema SKIN: Normal color for age and race; warm; no rash on exposed skin NEURO: Moves all extremities equally, normal speech PSYCH: The patient's mood and manner are appropriate.  ED Results / Procedures / Treatments   LABS: (all labs ordered are listed, but only abnormal results are displayed) Labs Reviewed  COMPREHENSIVE METABOLIC PANEL - Abnormal; Notable for the following components:      Result Value   AST 14 (*)    All other components within normal limits  CBC - Abnormal; Notable for the following components:   WBC 11.3 (*)    Hemoglobin 17.3 (*)    All other components within normal limits  URINALYSIS, ROUTINE W REFLEX MICROSCOPIC - Abnormal; Notable for the following components:   Color, Urine YELLOW (*)    APPearance CLOUDY (*)    Leukocytes,Ua LARGE (*)    All other components within normal limits  CHLAMYDIA/NGC RT PCR ([facility] ONLY)            URINE CULTURE  LIPASE, BLOOD      EKG:  RADIOLOGY: My personal review and interpretation of imaging:    I have personally reviewed all radiology reports.   No results found.   PROCEDURES:  Critical Care performed: No     Procedures    IMPRESSION / MDM / ASSESSMENT AND PLAN / ED COURSE  I reviewed the triage vital signs and the nursing notes.    Patient here with symptoms here for lower abdominal pain and dysuria.     DIFFERENTIAL DIAGNOSIS (includes but not limited to):   STI, urethritis, UTI, doubt appendicitis   Patient's presentation is most consistent with acute complicated illness / injury requiring diagnostic workup.   PLAN: Workup obtained from triage.  Patient has slight leukocytosis.  Normal creatinine, LFTs and lipase.  Urine does appear infected with greater than 50,000 white blood cells and large leukocyte esterase.  No bacteria seen.  Culture pending.  Patient is concerning for STIs.  Gonorrhea and Chlamydia pending.  Will give Rocephin here and doxycycline for urethritis.  Recommended Tylenol, Motrin.  His abdominal exam is benign.   MEDICATIONS GIVEN IN ED: Medications  cefTRIAXone (ROCEPHIN) injection 500 mg (has no administration in time range)  doxycycline (VIBRA-TABS) tablet 100 mg (has no administration in time range)     ED COURSE:  At this time, I do not feel there is any life-threatening condition present. I reviewed all nursing notes, vitals, pertinent previous records.  All lab and urine results, EKGs, imaging ordered have been independently reviewed and interpreted by myself.  I reviewed all available radiology reports from any imaging ordered this visit.  Based on my assessment, I feel the patient is safe to be discharged home without further emergent workup and can continue workup as an outpatient as needed. Discussed all findings, treatment plan as well as usual and customary return precautions.  They verbalize understanding and are comfortable with this plan.   Outpatient follow-up has been provided as needed.  All questions have been answered.    CONSULTS:  none   OUTSIDE RECORDS REVIEWED: No previous records for review.       FINAL CLINICAL IMPRESSION(S) / ED DIAGNOSES   Final diagnoses:  Urethritis     Rx / DC Orders   ED Discharge Orders          Ordered    doxycycline (VIBRAMYCIN) 100 MG capsule  2 times daily        02/21/23 0205             Note:  This document was prepared using Dragon voice recognition software and may include unintentional dictation errors.   Dena Esperanza, Layla Maw, DO  02/21/23 0208  

## 2023-02-22 LAB — URINE CULTURE: Culture: <10000 — AB

## 2023-03-23 ENCOUNTER — Telehealth: Payer: Self-pay

## 2023-03-23 NOTE — Telephone Encounter (Signed)
Transition Care Management Unsuccessful Follow-up Telephone Call  Date of discharge and from where:  [place] 9/10  Attempts:  1st Attempt  Reason for unsuccessful TCM follow-up call:  No answer/busy   [facility]  [facility], [facility] Guide, Phone: [phone] Website: [email or web address]

## 2023-03-23 NOTE — Telephone Encounter (Signed)
Transition Care Management Follow-up Telephone Call Date of discharge and from where: [place] 9/10 How have you been since you were released from the hospital? Patient stated he was blessed and has not followed up with a PCP because he doesn't have a PCP Any questions or concerns? No  Items Reviewed: Did the pt receive and understand the discharge instructions provided? Yes  Medications obtained and verified? Yes  Other? No  Any new allergies since your discharge? No  Dietary orders reviewed? No Do you have support at home? Yes     Follow up appointments reviewed:  PCP Hospital f/u appt confirmed? No  Scheduled to see  on  @ . Specialist Hospital f/u appt confirmed? No  Scheduled to see  on  @ . Are transportation arrangements needed? No  If their condition worsens, is the pt aware to call PCP or go to the Emergency Dept.? Yes Was the patient provided with contact information for the PCP's office or ED? Yes Was to pt encouraged to call back with questions or concerns? Yes

## 2023-12-13 ENCOUNTER — Other Ambulatory Visit: Payer: Self-pay

## 2023-12-13 ENCOUNTER — Emergency Department (HOSPITAL_COMMUNITY)
Admission: EM | Admit: 2023-12-13 | Discharge: 2023-12-13 | Disposition: A | Discharge status: Home / Self Care | Payer: Self-pay | Attending: Emergency Medicine | Admitting: Emergency Medicine

## 2023-12-13 ENCOUNTER — Encounter (HOSPITAL_COMMUNITY): Payer: Self-pay

## 2023-12-13 ENCOUNTER — Emergency Department (HOSPITAL_COMMUNITY): Payer: Self-pay

## 2023-12-13 DIAGNOSIS — S6991XA Unspecified injury of right wrist, hand and finger(s), initial encounter: Secondary | ICD-10-CM | POA: Insufficient documentation

## 2023-12-13 DIAGNOSIS — S0012XA Contusion of left eyelid and periocular area, initial encounter: Secondary | ICD-10-CM | POA: Insufficient documentation

## 2023-12-13 HISTORY — DX: Alcohol abuse, uncomplicated: F10.10

## 2023-12-13 MED ORDER — TETRACAINE HCL 0.5 % OP SOLN
2.0000 [drp] | Freq: Once | OPHTHALMIC | Status: AC
  Administered 2023-12-13: 2 [drp] via OPHTHALMIC
  Filled 2023-12-13: qty 4

## 2023-12-13 MED ORDER — IBUPROFEN 400 MG PO TABS: 400.0000 mg | ORAL_TABLET | Freq: Four times a day (QID) | ORAL | 0 refills | Status: AC | PRN

## 2023-12-13 MED ORDER — ACETAMINOPHEN 500 MG PO TABS: 1000.0000 mg | ORAL_TABLET | Freq: Four times a day (QID) | ORAL | 0 refills | Status: AC | PRN

## 2023-12-13 MED ORDER — FLUORESCEIN SODIUM 1 MG OP STRP
1.0000 | ORAL_STRIP | Freq: Once | OPHTHALMIC | Status: AC
  Administered 2023-12-13: 1 via OPHTHALMIC
  Filled 2023-12-13: qty 1

## 2023-12-13 NOTE — Progress Notes (Signed)
 Orthopedic Tech Progress Note Patient Details:  Erik Thomas. 1990/11/16 [id number]  Ortho Devices Type of Ortho Device: Thumb velcro splint Ortho Device/Splint Location: RUE Ortho Device/Splint Interventions: Ordered, Application, Adjustment, Other (comment)   Post Interventions Patient Tolerated: Fair Instructions Provided: Care of device  Delanna LITTIE Pac 12/13/2023, 10:12 AM

## 2023-12-13 NOTE — Discharge Instructions (Addendum)
 Wear the wrist brace until you can see Dr. Shari, the orthopedic doctor. Return to ED if your condition suddenly worsens.  You can take one Advil  tablet every 4-6 hours as needed.  You can take two Tylenol  every 6 hours.  Chronic and excessive use of alcohol can increase your risk of injury and health-related issues. I've included a resource guide for counseling.

## 2023-12-13 NOTE — ED Notes (Signed)
 Patient transported to X-ray
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
## 2023-12-13 NOTE — ED Notes (Signed)
 Ortho tech paged for wrist brace application.
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
